# Patient Record
Sex: Male | Born: 1990 | Marital: Single | Smoking: Never smoker
Health system: Southern US, Community
[De-identification: ages and names within clinical notes are randomized; demographics above are authoritative.]

## PROBLEM LIST (undated history)

## (undated) DIAGNOSIS — I471 Supraventricular tachycardia, unspecified: Secondary | ICD-10-CM

## (undated) HISTORY — DX: Supraventricular tachycardia, unspecified: I47.10

## (undated) HISTORY — PX: TYMPANOSTOMY TUBE PLACEMENT: SHX32

## (undated) HISTORY — DX: Supraventricular tachycardia: I47.1

## (undated) HISTORY — PX: WISDOM TOOTH EXTRACTION: SHX21

## (undated) HISTORY — PX: FOOT SURGERY: SHX648

---
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
## 2013-10-24 ENCOUNTER — Ambulatory Visit: Payer: 59 | Admitting: Cardiovascular Disease

## 2013-10-31 ENCOUNTER — Encounter (INDEPENDENT_AMBULATORY_CARE_PROVIDER_SITE_OTHER): Payer: Self-pay

## 2013-10-31 ENCOUNTER — Encounter: Payer: Self-pay | Admitting: Cardiovascular Disease

## 2013-10-31 ENCOUNTER — Ambulatory Visit (INDEPENDENT_AMBULATORY_CARE_PROVIDER_SITE_OTHER): Payer: 59 | Admitting: Cardiovascular Disease

## 2013-10-31 VITALS — BP 112/64 | HR 89 | Ht 71.0 in | Wt 190.5 lb

## 2013-10-31 DIAGNOSIS — I471 Supraventricular tachycardia: Secondary | ICD-10-CM

## 2013-10-31 DIAGNOSIS — I498 Other specified cardiac arrhythmias: Secondary | ICD-10-CM

## 2013-10-31 DIAGNOSIS — R0789 Other chest pain: Secondary | ICD-10-CM

## 2013-10-31 MED ORDER — FLECAINIDE ACETATE 50 MG PO TABS: 50.0000 mg | ORAL_TABLET | Freq: Two times a day (BID) | ORAL | Status: AC

## 2013-10-31 NOTE — Progress Notes (Signed)
Patient ID: Dennis Bullock, male    DOB: 01/22/1991, 23 y.o.   MRN: [id number]  HPI Comments: [name] is a 23 year old gentleman who is currently in [employer] who started having tachycardia episodes at age 23 or [age]. Last seen in clinic in 2013 for SVT. At that time the event monitor confirmed reentrant rhythm with heart rate ranging from 180 up to 230 beats per minute . Approximately 30 or more episodes when last seen in clinic. He presents for routine followup  In followup today, he reports that he continues to have episodes of tachycardia. Typically he has episodes twice per week lasting up to 40 minutes at a time. He presents today with his mother and is very interested in having an ablation. He reports that it is affecting his lifestyle. He has such frequent episodes that it has become very bothersome. He does not see himself taking a medication for the rest of his life and as he has had this arrhythmia for many years, he would like closure/resolution. When he is in this rhythm, he is very symptomatic, has shortness of breath. He reports that he is typically not in a place where he is able to lie down as he is in classes most of the day.  EKG shows sinus bradycardia with rate 89 beats per minute with no significant ST or T wave changes   Outpatient Encounter Prescriptions as of 10/31/2013  Medication Sig  . Glucosamine Sulfate 500 MG TABS Take 500 mg by mouth daily.  . Multiple Vitamin (MULTIVITAMIN) tablet Take 1 tablet by mouth daily.  . traZODone (DESYREL) 50 MG tablet Take 50 mg by mouth at bedtime.     Review of Systems  Constitutional: Negative.   HENT: Negative.   Eyes: Negative.   Respiratory: Negative.   Cardiovascular: Positive for palpitations.       Tachycardia  Gastrointestinal: Negative.   Endocrine: Negative.   Musculoskeletal: Negative.   Skin: Negative.   Allergic/Immunologic: Negative.   Neurological: Negative.   Hematological: Negative.    Psychiatric/Behavioral: Negative.   All other systems reviewed and are negative.   BP 112/64  Pulse 89  Ht 5\' 11"  (1.803 m)  Wt 190 lb 8 oz (86.41 kg)  BMI 26.58 kg/m2  Physical Exam  Nursing note and vitals reviewed. Constitutional: He is oriented to person, place, and time. He appears well-developed and well-nourished.  HENT:  Head: Normocephalic.  Nose: Nose normal.  Mouth/Throat: Oropharynx is clear and moist.  Eyes: Conjunctivae are normal. Pupils are equal, round, and reactive to light.  Neck: Normal range of motion. Neck supple. No JVD present.  Cardiovascular: Normal rate, regular rhythm, S1 normal, S2 normal, normal heart sounds and intact distal pulses.  Exam reveals no gallop and no friction rub.   No murmur heard. Pulmonary/Chest: Effort normal and breath sounds normal. No respiratory distress. He has no wheezes. He has no rales. He exhibits no tenderness.  Abdominal: Soft. Bowel sounds are normal. He exhibits no distension. There is no tenderness.  Musculoskeletal: Normal range of motion. He exhibits no edema and no tenderness.  Lymphadenopathy:    He has no cervical adenopathy.  Neurological: He is alert and oriented to person, place, and time. Coordination normal.  Skin: Skin is warm and dry. No rash noted. No erythema.  Psychiatric: He has a normal mood and affect. His behavior is normal. Judgment and thought content normal.      Assessment and Plan

## 2013-10-31 NOTE — Assessment & Plan Note (Signed)
He  continues to have symptomatic SVT on a regular basis. This was previously noted on event monitor in 2013, heart rates 180 up to 230 beats per minute. Symptoms still happening twice per week. He is eager to have an ablation and after a long discussion, is not particularly interested in taking medications on a regular basis. As he is symptomatic, he is willing to take flecainide 50 mg twice a day for symptom relief until his evaluation by EP. Appointment has been made in [place] to meet with Dr. Ladona Ridgelaylor for further evaluation.

## 2013-10-31 NOTE — Patient Instructions (Signed)
Please start flecainide 50 mg twice a day  We have scheduled an appt with Dr. Johney FrameAllred for evaluation for SVT ablation  Please call us if you have new issues that need to be addressed before your next appt.

## 2013-11-23 ENCOUNTER — Institutional Professional Consult (permissible substitution): Payer: 59 | Admitting: Internal Medicine
